# Patient Record
Sex: Female | Born: 1975 | Race: Black or African American | Hispanic: No | Marital: Single | Smoking: Current some day smoker
Health system: Southern US, Community
[De-identification: ages and names within clinical notes are randomized; demographics above are authoritative.]

## PROBLEM LIST (undated history)

## (undated) DIAGNOSIS — I10 Essential (primary) hypertension: Secondary | ICD-10-CM

---

## 2003-12-20 ENCOUNTER — Emergency Department: Payer: Self-pay | Admitting: Emergency Medicine

## 2004-02-02 ENCOUNTER — Emergency Department: Payer: Self-pay | Admitting: Emergency Medicine

## 2004-05-23 ENCOUNTER — Emergency Department: Payer: Self-pay | Admitting: Emergency Medicine

## 2004-06-22 ENCOUNTER — Emergency Department: Payer: Self-pay | Admitting: Unknown Physician Specialty

## 2004-09-06 ENCOUNTER — Other Ambulatory Visit: Payer: Self-pay

## 2004-09-06 ENCOUNTER — Emergency Department: Payer: Self-pay | Admitting: Unknown Physician Specialty

## 2005-04-12 ENCOUNTER — Ambulatory Visit: Payer: Self-pay | Admitting: Internal Medicine

## 2005-05-04 ENCOUNTER — Emergency Department: Payer: Self-pay | Admitting: Unknown Physician Specialty

## 2005-05-25 ENCOUNTER — Emergency Department: Payer: Self-pay | Admitting: Emergency Medicine

## 2005-06-22 ENCOUNTER — Emergency Department: Payer: Self-pay | Admitting: Emergency Medicine

## 2005-06-29 ENCOUNTER — Emergency Department: Payer: Self-pay | Admitting: Emergency Medicine

## 2005-08-05 ENCOUNTER — Emergency Department: Payer: Self-pay | Admitting: Emergency Medicine

## 2005-10-05 ENCOUNTER — Emergency Department: Payer: Self-pay | Admitting: Unknown Physician Specialty

## 2006-01-11 ENCOUNTER — Other Ambulatory Visit: Payer: Self-pay

## 2006-01-11 ENCOUNTER — Emergency Department: Payer: Self-pay

## 2006-05-16 ENCOUNTER — Other Ambulatory Visit: Payer: Self-pay

## 2006-05-16 ENCOUNTER — Emergency Department: Payer: Self-pay | Admitting: Emergency Medicine

## 2006-06-28 ENCOUNTER — Emergency Department: Payer: Self-pay | Admitting: Emergency Medicine

## 2006-09-15 ENCOUNTER — Emergency Department: Payer: Self-pay | Admitting: Emergency Medicine

## 2006-11-07 ENCOUNTER — Emergency Department: Payer: Self-pay

## 2006-11-07 ENCOUNTER — Other Ambulatory Visit: Payer: Self-pay

## 2006-12-01 ENCOUNTER — Emergency Department: Payer: Self-pay | Admitting: Internal Medicine

## 2007-08-17 ENCOUNTER — Emergency Department: Payer: Self-pay | Admitting: Internal Medicine

## 2007-12-03 IMAGING — CR DG LUMBAR SPINE AP/LAT/OBLIQUES W/ FLEX AND EXT
1 series · 5 of 5 positions shown · non-contrast
Comparison: none

REASON FOR EXAM: Back pain
COMMENTS:

[Series 1: view not recorded · 0.17mm/px · 5 of 5 slices shown]
[im 1/5]
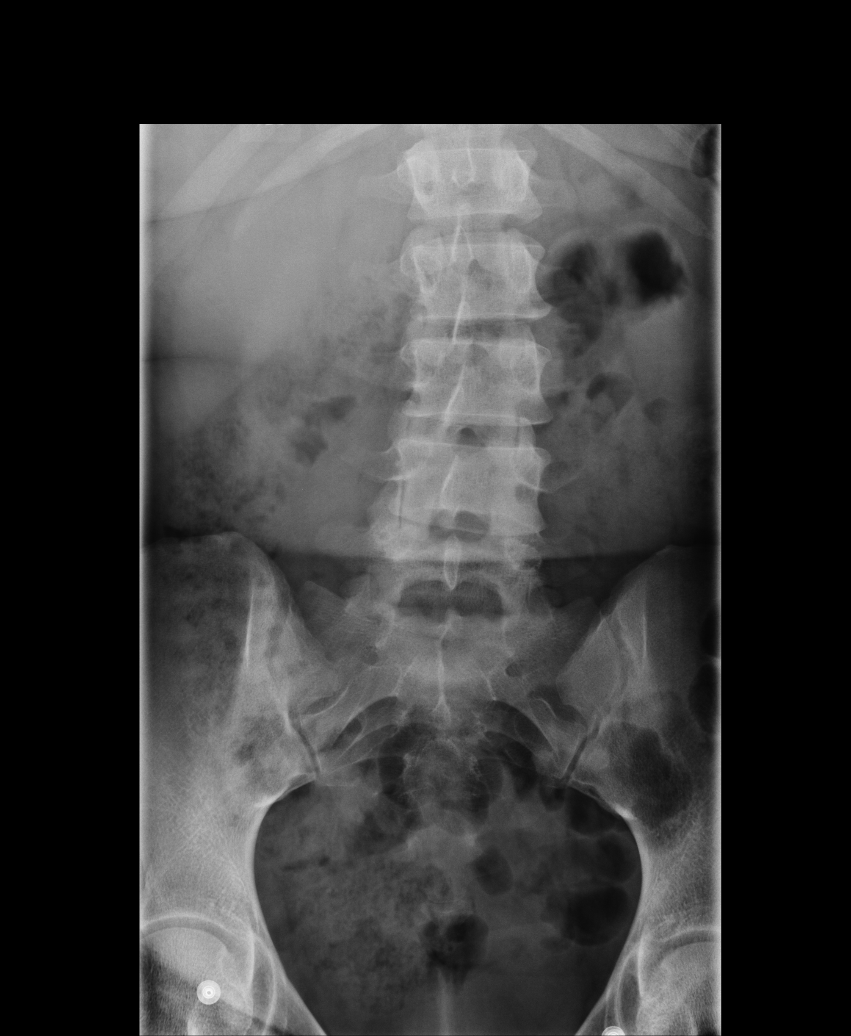
[im 2/5]
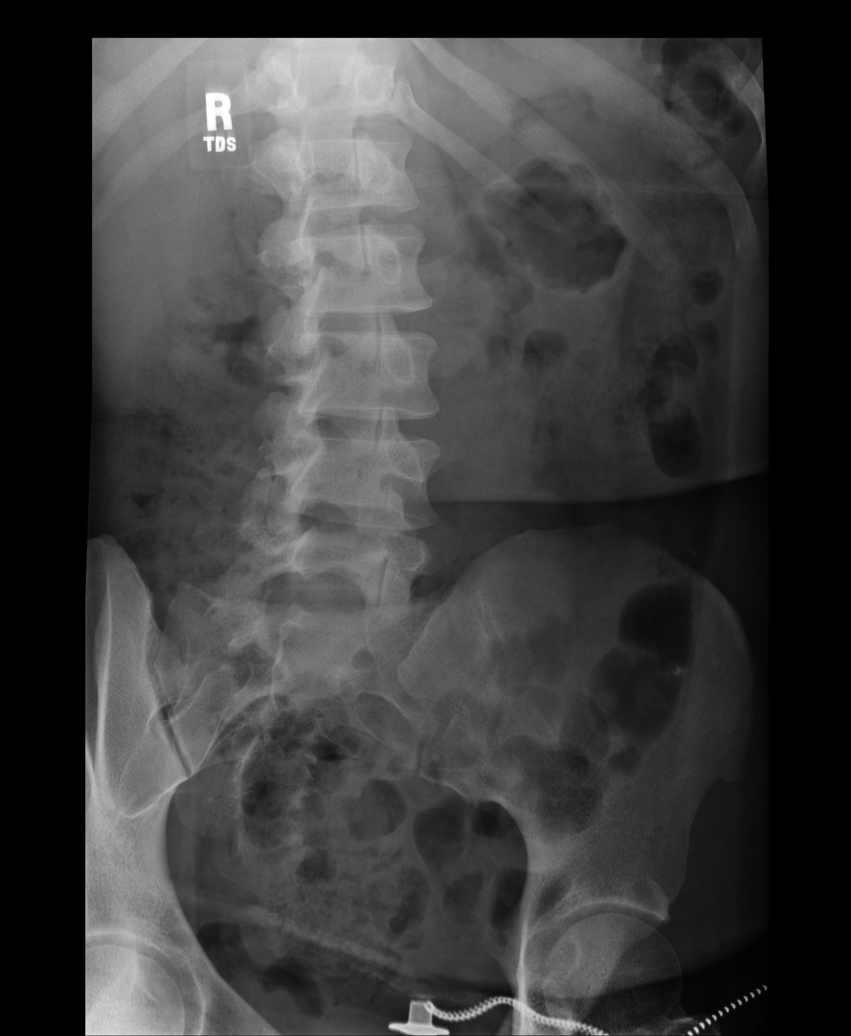
[im 3/5]
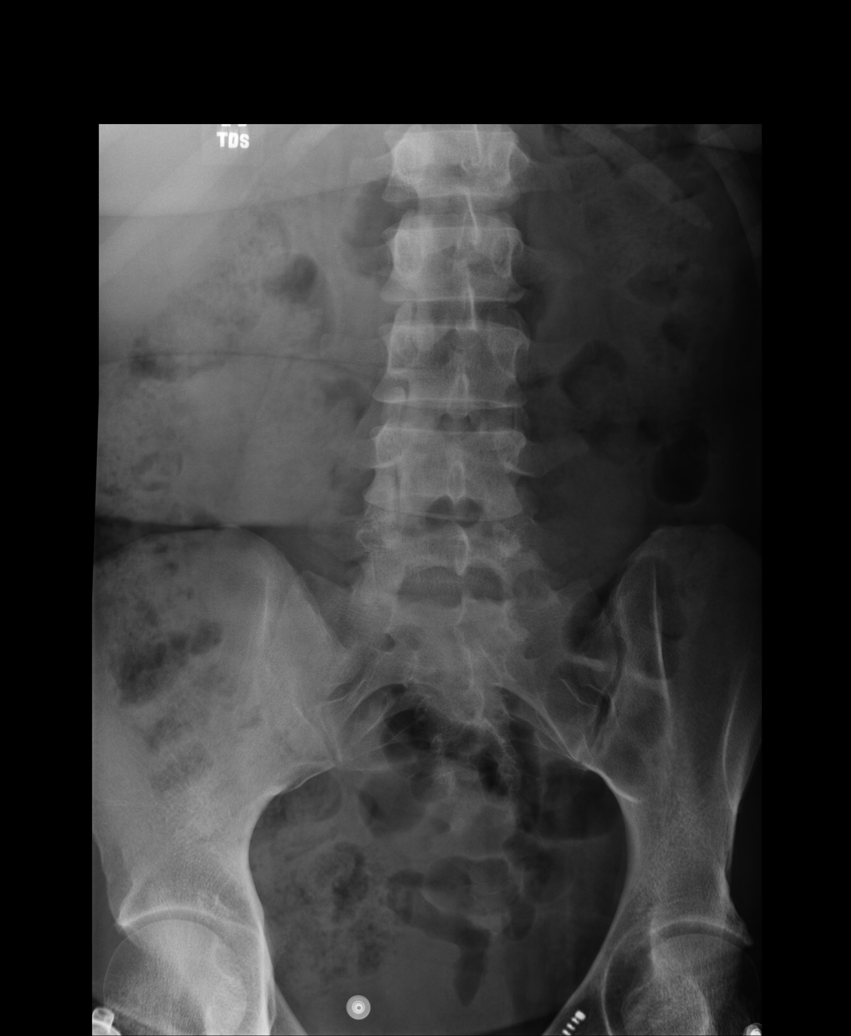
[im 4/5]
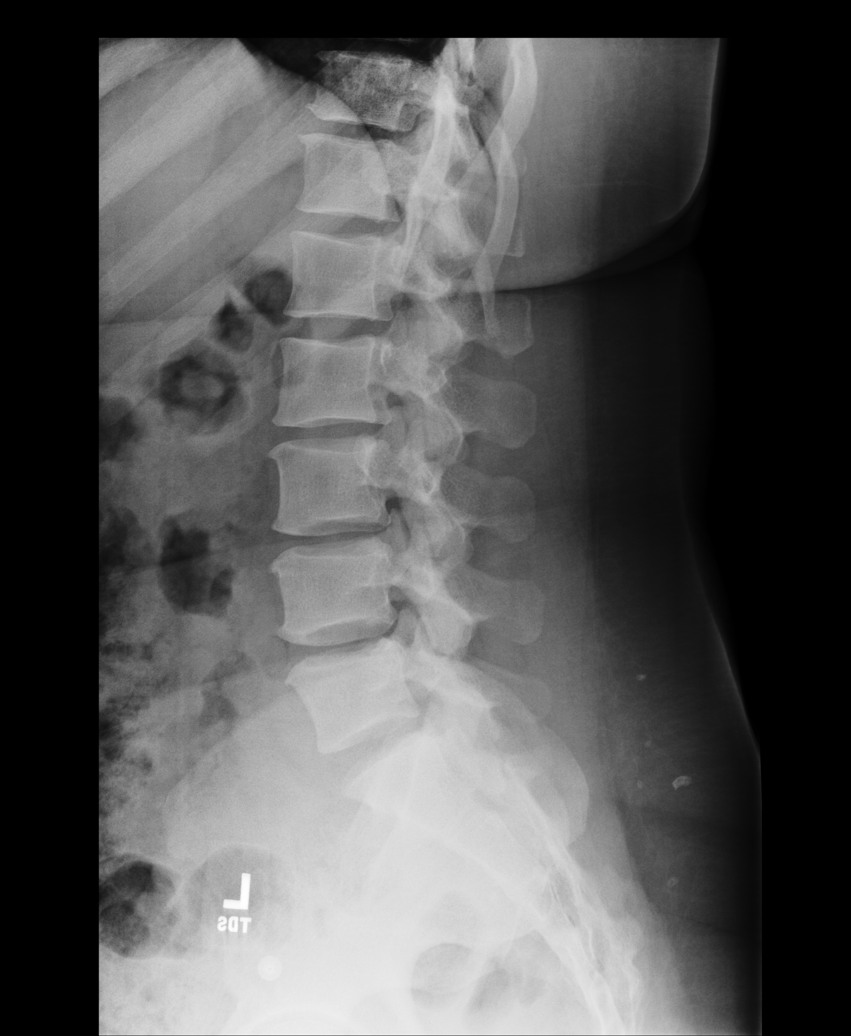
[im 5/5]
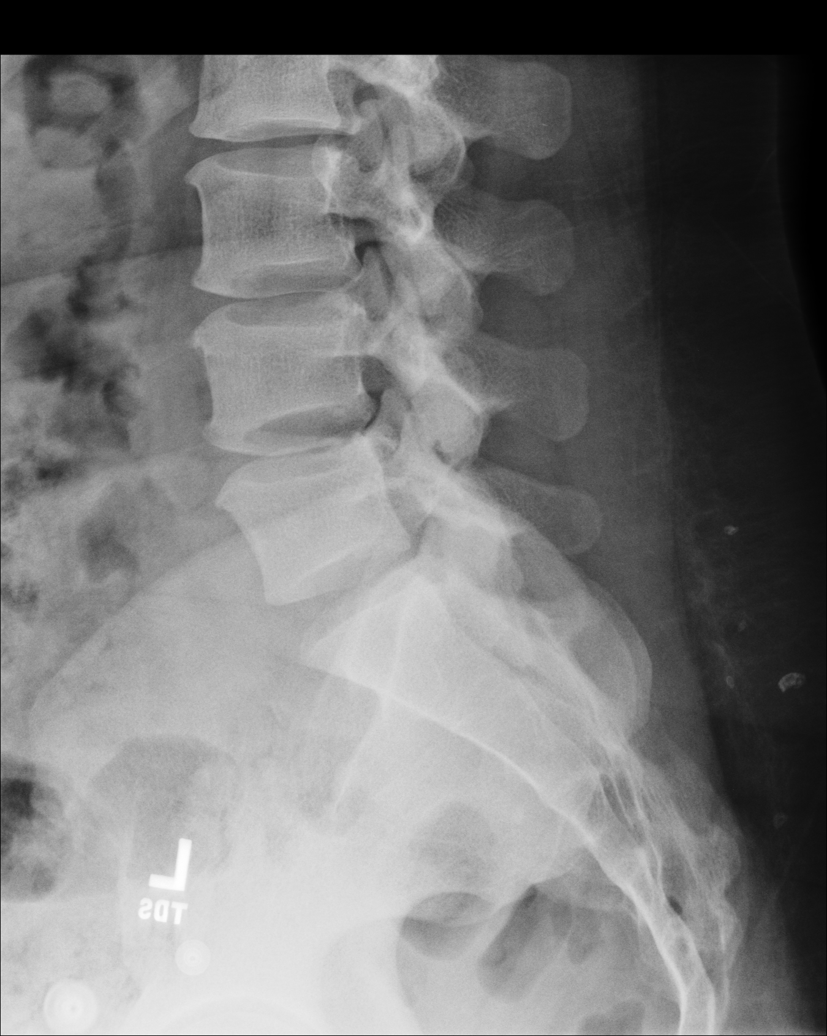

[5 of 5 positions shown; findings below may reference images not displayed]

PROCEDURE:     DXR - DXR LUMBAR SPINE WITH OBLIQUES  - May 25, 2005 [DATE]

RESULT:          Findings consistent with early degenerative disc disease
are appreciated at the T11-12 level, the L2-3 level, and 3, 4 and 5 levels.
This is evident by mild anterior hypertrophic spurring.  No evidence of
acute fracture or dislocation is appreciated.  Five non-rib-bearing lumbar
vertebral bodies are appreciated.
IMPRESSION: Findings likely representing sequela of early or mild
degenerative disc disease as described above without evidence of acute
fracture.  If there are persistent complaints of pain or persistent clinical
concern, further evaluation with lumbar MRI is recommended if clinically
warranted.

## 2008-12-26 ENCOUNTER — Emergency Department: Payer: Self-pay | Admitting: Emergency Medicine

## 2009-11-07 ENCOUNTER — Emergency Department: Payer: Self-pay | Admitting: Emergency Medicine

## 2012-05-18 IMAGING — CR DG CHEST 2V
1 series · 2 of 2 positions shown · non-contrast
Comparison: none

REASON FOR EXAM: COUGH, CP
COMMENTS:

PROCEDURE:     DXR - DXR CHEST PA (OR AP) AND LATERAL  - November 08, 2009  [DATE]
RESULT:     Comparison: 01/11/2006

[Series 1: view not recorded · 0.17mm/px · 2 of 2 slices shown]
[im 1/2]
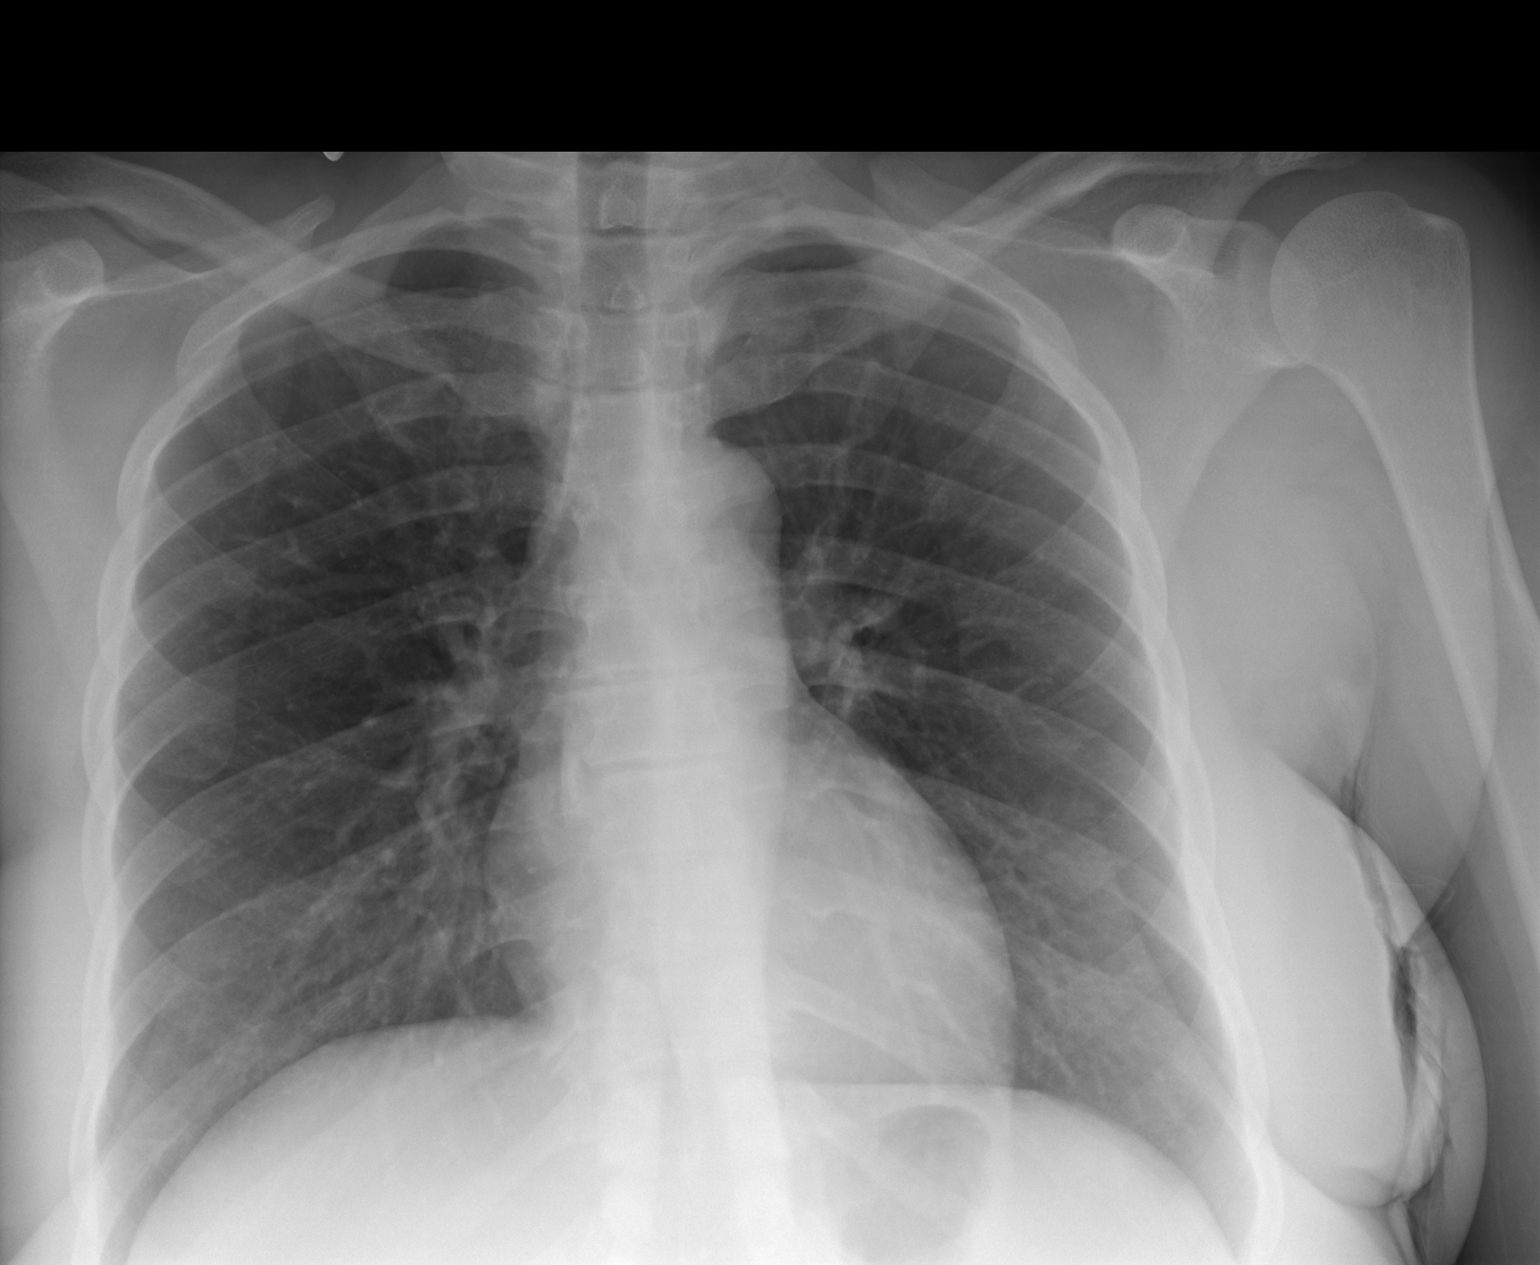
[im 2/2]
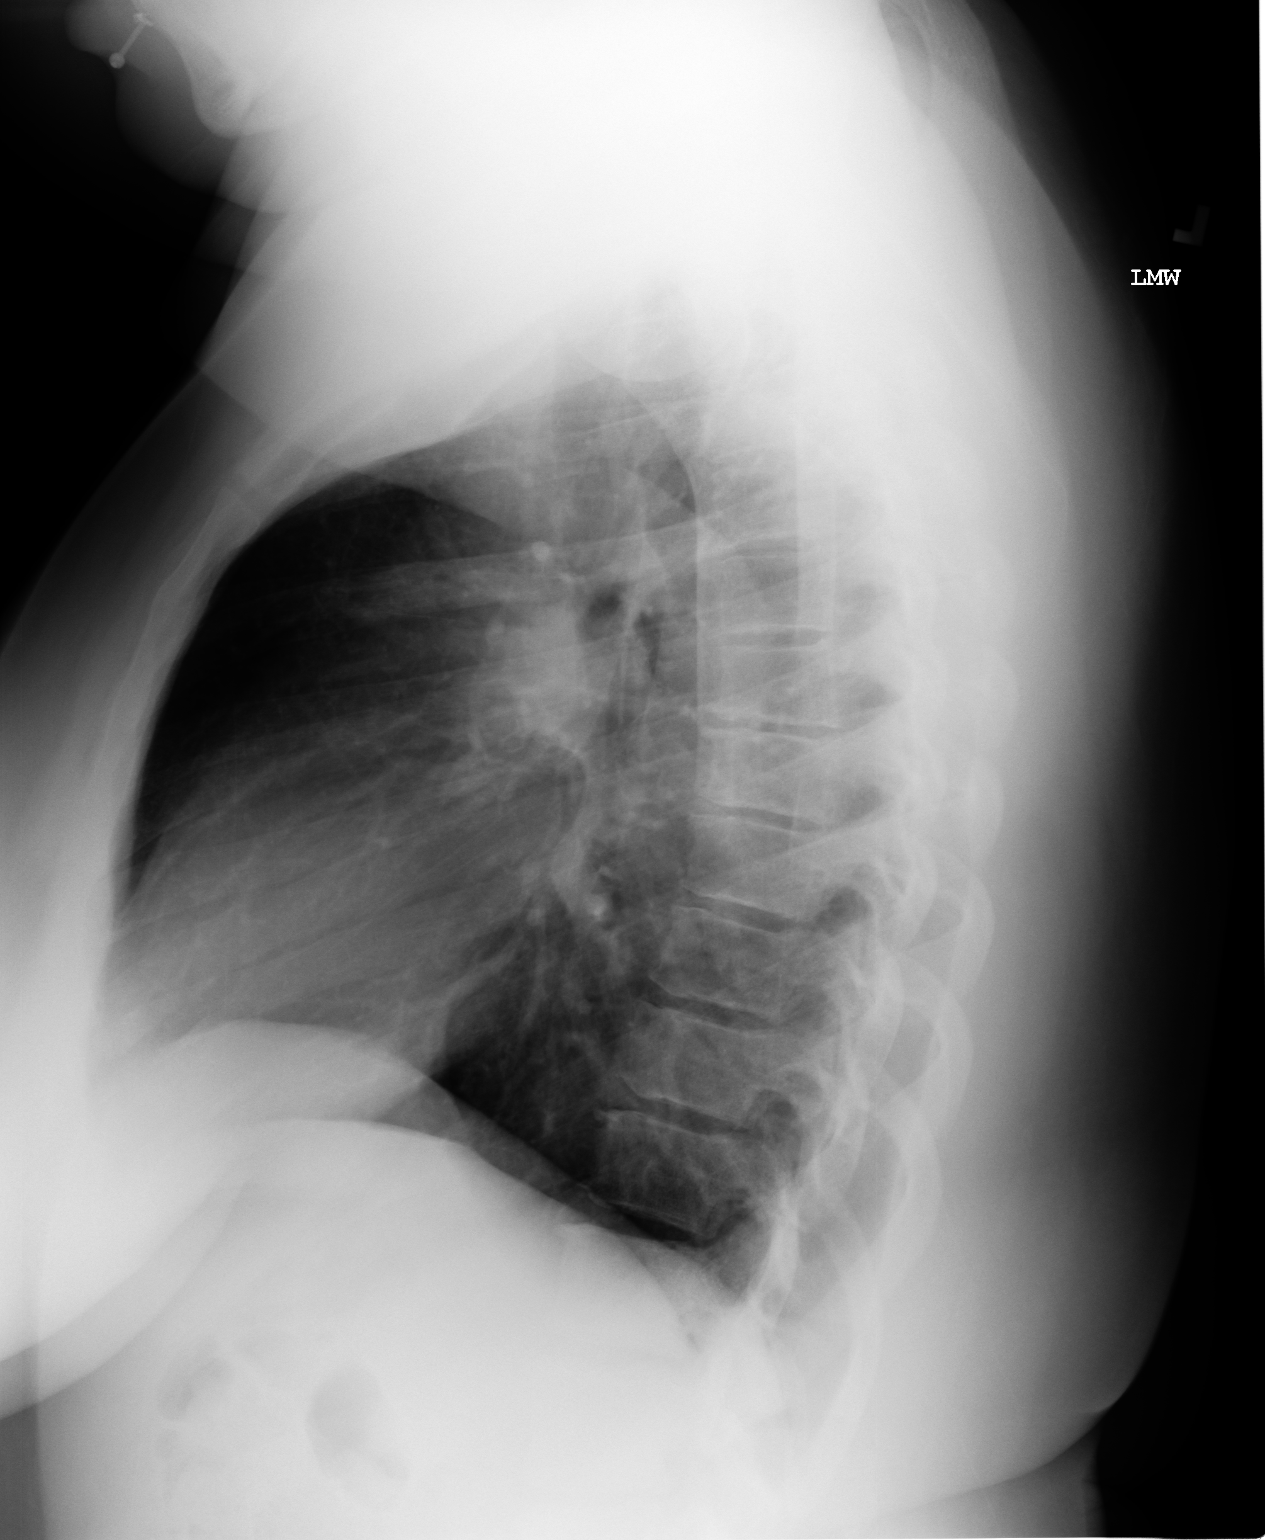

[2 of 2 positions shown; findings below may reference images not displayed]

FINDINGS: Heart and mediastinum are within normal limits. The lungs are clear. The
very tip of the right costophrenic angle is excluded the field-of-view.
IMPRESSION: No acute cardiopulmonary disease.

## 2012-08-11 ENCOUNTER — Emergency Department: Payer: Self-pay | Admitting: Emergency Medicine

## 2012-08-11 LAB — URINALYSIS, COMPLETE
Bacteria: NONE SEEN
Bilirubin,UR: NEGATIVE
Blood: NEGATIVE
Glucose,UR: NEGATIVE mg/dL
Hyaline Cast: 3
Ketone: NEGATIVE
Nitrite: NEGATIVE
Ph: 6
Protein: NEGATIVE
RBC,UR: 4 /HPF
Specific Gravity: 1.024
Squamous Epithelial: 6
WBC UR: 9 /HPF

## 2019-01-19 ENCOUNTER — Other Ambulatory Visit: Payer: Self-pay

## 2019-01-19 DIAGNOSIS — Z20822 Contact with and (suspected) exposure to covid-19: Secondary | ICD-10-CM

## 2019-01-20 LAB — NOVEL CORONAVIRUS, NAA: SARS-CoV-2, NAA: NOT DETECTED

## 2019-01-22 ENCOUNTER — Telehealth: Payer: Self-pay | Admitting: Hematology

## 2019-01-22 NOTE — Telephone Encounter (Signed)
Pt is aware covid 19 results is neg °

## 2019-04-12 ENCOUNTER — Ambulatory Visit: Payer: Medicaid Other | Attending: Internal Medicine

## 2019-04-12 DIAGNOSIS — U071 COVID-19: Secondary | ICD-10-CM | POA: Insufficient documentation

## 2019-04-12 DIAGNOSIS — Z20822 Contact with and (suspected) exposure to covid-19: Secondary | ICD-10-CM

## 2019-04-13 LAB — NOVEL CORONAVIRUS, NAA: SARS-CoV-2, NAA: DETECTED — AB

## 2019-04-25 ENCOUNTER — Ambulatory Visit: Payer: Medicaid Other | Attending: Internal Medicine

## 2019-04-25 ENCOUNTER — Ambulatory Visit: Payer: Medicaid Other

## 2019-04-25 DIAGNOSIS — Z20822 Contact with and (suspected) exposure to covid-19: Secondary | ICD-10-CM

## 2019-04-26 LAB — NOVEL CORONAVIRUS, NAA: SARS-CoV-2, NAA: NOT DETECTED

## 2019-05-01 ENCOUNTER — Ambulatory Visit: Payer: Medicaid Other | Attending: Internal Medicine

## 2019-05-01 DIAGNOSIS — Z20822 Contact with and (suspected) exposure to covid-19: Secondary | ICD-10-CM

## 2019-05-02 LAB — NOVEL CORONAVIRUS, NAA: SARS-CoV-2, NAA: NOT DETECTED

## 2019-05-03 ENCOUNTER — Telehealth: Payer: Self-pay

## 2019-05-03 NOTE — Telephone Encounter (Signed)
Patient called in and received her negative covid test result  

## 2021-10-19 ENCOUNTER — Ambulatory Visit
Admission: EM | Admit: 2021-10-19 | Discharge: 2021-10-19 | Disposition: A | Discharge status: Home / Self Care | Payer: Medicaid Other

## 2021-10-19 ENCOUNTER — Other Ambulatory Visit: Payer: Self-pay

## 2021-10-19 ENCOUNTER — Encounter: Payer: Self-pay | Admitting: Emergency Medicine

## 2021-10-19 DIAGNOSIS — I10 Essential (primary) hypertension: Secondary | ICD-10-CM

## 2021-10-19 HISTORY — DX: Essential (primary) hypertension: I10

## 2021-10-19 MED ORDER — LOSARTAN POTASSIUM 50 MG PO TABS: 50.0000 mg | ORAL_TABLET | Freq: Every day | ORAL | 0 refills | Status: DC

## 2021-10-19 MED ORDER — AMLODIPINE BESYLATE 10 MG PO TABS: 10.0000 mg | ORAL_TABLET | Freq: Every day | ORAL | 0 refills | Status: DC

## 2021-10-19 MED ORDER — CHLORTHALIDONE 25 MG PO TABS: 25.0000 mg | ORAL_TABLET | Freq: Every day | ORAL | 0 refills | Status: DC

## 2021-10-19 MED ORDER — HYDROXYZINE HCL 10 MG PO TABS: 10.0000 mg | ORAL_TABLET | Freq: Three times a day (TID) | ORAL | 0 refills | Status: DC | PRN

## 2021-10-19 NOTE — Discharge Instructions (Addendum)
Hypertension refilled her medication  Anxiety; Hydroxyzine 10 mg 1 three times a day Follow up with PCP for further refills.

## 2021-10-19 NOTE — ED Triage Notes (Signed)
Vomiting and dizziness last week.  Patient went to [facility].   Blood pressure was "155/135 " per patient.  Was not prescribed any medicine.    Patient does not have a pcp currently, but has a first appt with [facility] in September.  Denies any edema, sob or chest pain

## 2021-10-19 NOTE — ED Provider Notes (Addendum)
[facility]    CSN: [id number] Arrival date & time: 10/19/21  1607      History   Chief Complaint Chief Complaint  Patient presents with   Hypertension    HPI Desiree Beasley is a 46 y.o. female.   HPI She is in today for high blood pressure. Denies fever, chills, headache, dizziness, visual changes,shortness of breath, dyspnea on exertion, chest pain, nausea, constipation, diarrhea, or any edema. She reports that she is anxious since the death of her mother. She reports that she was on previous treatment that was over powering so this was discontinued.  Past Medical History:  Diagnosis Date   Hypertension     There are no problems to display for this patient.   History reviewed. No pertinent surgical history.  OB History   No obstetric history on file.      Home Medications    Prior to Admission medications   Medication Sig Start Date End Date Taking? Authorizing Provider  amLODipine (NORVASC) 10 MG tablet Take 1 tablet (10 mg total) by mouth daily. 10/19/21 11/18/21 Yes Barbette Merino, NP  chlorthalidone (HYGROTON) 25 MG tablet Take 1 tablet (25 mg total) by mouth daily. 10/19/21 11/18/21 Yes Barbette Merino, NP  hydrOXYzine (ATARAX) 10 MG tablet Take 1 tablet (10 mg total) by mouth every 8 (eight) hours as needed for itching. 10/19/21 11/18/21 Yes Danilyn Cocke, Shana Chute, NP  losartan (COZAAR) 50 MG tablet Take 1 tablet (50 mg total) by mouth daily. 10/19/21 11/18/21 Yes Barbette Merino, NP  NON FORMULARY    Yes [provider]  meclizine (ANTIVERT) 25 MG tablet Take 25 mg by mouth 3 (three) times daily as needed. 10/14/21   [provider]    Family History History reviewed. No pertinent family history.  Social History Social History   Tobacco Use   Smoking status: Some Days    Types: Cigarettes   Smokeless tobacco: Never  Vaping Use   Vaping Use: Never used  Substance Use Topics   Alcohol use: Yes   Drug use: Never     Allergies    Lisinopril and Sulfa antibiotics   Review of Systems Review of Systems   Physical Exam Triage Vital Signs ED Triage Vitals  Enc Vitals Group     BP 10/19/21 1650 (!) 154/107     Pulse Rate 10/19/21 1650 100     Resp 10/19/21 1650 20     Temp 10/19/21 1650 98.6 F (37 C)     Temp Source 10/19/21 1650 Oral     SpO2 10/19/21 1650 98 %     Weight --      Height --      Head Circumference --      Peak Flow --      Pain Score 10/19/21 1644 0     Pain Loc --      Pain Edu? --      Excl. in [place]? --    No data found.  Updated Vital Signs BP (!) 149/103   Pulse 100   Temp 98.6 F (37 C) (Oral)   Resp 20   LMP 09/14/2021   SpO2 98%   Visual Acuity Right Eye Distance:   Left Eye Distance:   Bilateral Distance:    Right Eye Near:   Left Eye Near:    Bilateral Near:     Physical Exam Constitutional:      General: She is not in acute distress.  Appearance: She is obese. She is not ill-appearing, toxic-appearing or diaphoretic.  HENT:     Head: Normocephalic and atraumatic.  Cardiovascular:     Rate and Rhythm: Normal rate.  Pulmonary:     Effort: Pulmonary effort is normal.  Musculoskeletal:        General: Normal range of motion.  Skin:    General: Skin is warm and dry.     Capillary Refill: Capillary refill takes less than 2 seconds.  Neurological:     General: No focal deficit present.     Mental Status: She is alert and oriented to person, place, and time.  Psychiatric:        Mood and Affect: Mood normal.     Comments: Tearful      UC Treatments / Results  Labs (all labs ordered are listed, but only abnormal results are displayed) Labs Reviewed - No data to display  EKG   Radiology No results found.  Procedures Procedures (including critical care time)  Medications Ordered in UC Medications - No data to display  Initial Impression / Assessment and Plan / UC Course  I have reviewed the triage vital signs and the nursing  notes.  Pertinent labs & imaging results that were available during my care of the patient were reviewed by me and considered in my medical decision making (see chart for details).     Hypertension Final Clinical Impressions(s) / UC Diagnoses   Final diagnoses:  Essential hypertension     Discharge Instructions      Hypertension refilled her medication  Anxiety; Hydroxyzine 10 mg 1 three times a day Follow up with PCP for further refills.        ED Prescriptions     Medication Sig Dispense Auth. Provider   amLODipine (NORVASC) 10 MG tablet Take 1 tablet (10 mg total) by mouth daily. 30 tablet Thad Ranger M, NP   chlorthalidone (HYGROTON) 25 MG tablet Take 1 tablet (25 mg total) by mouth daily. 30 tablet Thad Ranger M, NP   losartan (COZAAR) 50 MG tablet Take 1 tablet (50 mg total) by mouth daily. 30 tablet Barbette Merino, NP   hydrOXYzine (ATARAX) 10 MG tablet Take 1 tablet (10 mg total) by mouth every 8 (eight) hours as needed for itching. 60 tablet Barbette Merino, NP      PDMP not reviewed this encounter.   Barbette Merino, NP 10/19/21 1914    Barbette Merino, NP 10/19/21 1914

## 2021-11-15 ENCOUNTER — Other Ambulatory Visit: Payer: Self-pay | Admitting: Nurse Practitioner

## 2021-12-08 ENCOUNTER — Encounter: Payer: Self-pay | Admitting: Emergency Medicine

## 2021-12-08 ENCOUNTER — Ambulatory Visit
Admission: EM | Admit: 2021-12-08 | Discharge: 2021-12-08 | Disposition: A | Discharge status: Home / Self Care | Payer: Medicaid Other | Attending: Physician Assistant | Admitting: Physician Assistant

## 2021-12-08 DIAGNOSIS — I10 Essential (primary) hypertension: Secondary | ICD-10-CM

## 2021-12-08 DIAGNOSIS — Z760 Encounter for issue of repeat prescription: Secondary | ICD-10-CM

## 2021-12-08 DIAGNOSIS — R42 Dizziness and giddiness: Secondary | ICD-10-CM

## 2021-12-08 MED ORDER — MECLIZINE HCL 25 MG PO TABS: 25.0000 mg | ORAL_TABLET | Freq: Three times a day (TID) | ORAL | 0 refills | Status: DC | PRN

## 2021-12-08 MED ORDER — LOSARTAN POTASSIUM 50 MG PO TABS: 50.0000 mg | ORAL_TABLET | Freq: Every day | ORAL | 0 refills | Status: DC

## 2021-12-08 MED ORDER — CLONIDINE HCL 0.1 MG PO TABS: 0.1000 mg | ORAL_TABLET | Freq: Every day | ORAL | Status: DC

## 2021-12-08 MED ORDER — CHLORTHALIDONE 25 MG PO TABS: 25.0000 mg | ORAL_TABLET | Freq: Every day | ORAL | 0 refills | Status: DC

## 2021-12-08 MED ORDER — CLONIDINE HCL 0.1 MG PO TABS
0.1000 mg | ORAL_TABLET | Freq: Once | ORAL | Status: AC
  Administered 2021-12-08: 0.1 mg via ORAL

## 2021-12-08 MED ORDER — AMLODIPINE BESYLATE 10 MG PO TABS: 10.0000 mg | ORAL_TABLET | Freq: Every day | ORAL | 0 refills | Status: DC

## 2021-12-08 NOTE — ED Triage Notes (Signed)
Pt c/o vertigo and hypertension. She has been out of her vertigo and BP medication for 4 days. She is scheduled to see a new PCP at the beginning of October. Denies sob, chest pain or swelling.

## 2021-12-08 NOTE — ED Provider Notes (Signed)
[facility]    CSN: [id number] Arrival date & time: 12/08/21  0815      History   Chief Complaint Chief Complaint  Patient presents with   Hypertension   Dizziness    HPI Desiree Beasley is a 46 y.o. female presenting for refill of blood pressure medications.  Patient takes amlodipine, chlorthalidone, and losartan.  She says she has been out of the medications for about 4 days and she started to feel dizzy and have vertigo today.  She thinks the dizziness may be related to her blood pressure being high.  She says when she was taking the medication her blood pressure was steady at 120/90 when she would check it.  She says she also did not have any dizziness at that time.  Occasionally takes meclizine for the dizziness and would like a refill of that as well.  Patient does have an appointment coming up in the next 3 weeks to see a new PCP at [facility].  She says she just needs a refill of the medication to get to that appointment.  She is not having any chest pain, palpitations, shortness of breath, severe headaches, vision changes, numbness/tingling or weakness.  Of note, patient went to [facility] emergency department on 10/13/2021 for the same symptoms and had an MRI of her brain which was normal.  She was referred to vestibular rehab.  HPI  Past Medical History:  Diagnosis Date   Hypertension     There are no problems to display for this patient.   History reviewed. No pertinent surgical history.  OB History   No obstetric history on file.      Home Medications    Prior to Admission medications   Medication Sig Start Date End Date Taking? Authorizing Provider  amLODipine (NORVASC) 10 MG tablet Take 1 tablet (10 mg total) by mouth daily. 12/08/21 01/07/22  Danton Clap, PA-C  chlorthalidone (HYGROTON) 25 MG tablet Take 1 tablet (25 mg total) by mouth daily. 12/08/21 01/07/22  Laurene Footman B, PA-C  losartan (COZAAR) 50 MG tablet Take 1 tablet (50 mg  total) by mouth daily. 12/08/21 01/07/22  Danton Clap, PA-C  meclizine (ANTIVERT) 25 MG tablet Take 1 tablet (25 mg total) by mouth 3 (three) times daily as needed. 12/08/21   Danton Clap, PA-C  NON FORMULARY     [provider]    Family History No family history on file.  Social History Social History   Tobacco Use   Smoking status: Some Days    Types: Cigarettes   Smokeless tobacco: Never  Vaping Use   Vaping Use: Never used  Substance Use Topics   Alcohol use: Yes   Drug use: Never     Allergies   Lisinopril and Sulfa antibiotics   Review of Systems Review of Systems  Constitutional:  Negative for fatigue.  Eyes:  Negative for visual disturbance.  Respiratory:  Negative for shortness of breath.   Cardiovascular:  Negative for chest pain, palpitations and leg swelling.  Gastrointestinal:  Negative for nausea and vomiting.  Neurological:  Positive for dizziness. Negative for facial asymmetry, weakness, light-headedness, numbness and headaches.     Physical Exam Triage Vital Signs ED Triage Vitals  Enc Vitals Group     BP      Pulse      Resp      Temp      Temp src      SpO2  Weight      Height      Head Circumference      Peak Flow      Pain Score      Pain Loc      Pain Edu?      Excl. in [place]?    No data found.  Updated Vital Signs BP (!) 189/145 (BP Location: Right Arm)   Pulse 95   Temp 98.8 F (37.1 C) (Oral)   Resp 16   Ht 6' 3.5" (1.918 m)   Wt 240 lb (108.9 kg)   LMP 10/27/2021 (Approximate)   SpO2 97%   BMI 29.60 kg/m     Physical Exam Vitals and nursing note reviewed.  Constitutional:      General: She is not in acute distress.    Appearance: Normal appearance. She is not ill-appearing or toxic-appearing.  HENT:     Head: Normocephalic and atraumatic.     Nose: Nose normal.     Mouth/Throat:     Mouth: Mucous membranes are moist.     Pharynx: Oropharynx is clear.  Eyes:     General: No scleral icterus.        Right eye: No discharge.        Left eye: No discharge.     Extraocular Movements: Extraocular movements intact.     Conjunctiva/sclera: Conjunctivae normal.     Pupils: Pupils are equal, round, and reactive to light.  Cardiovascular:     Rate and Rhythm: Normal rate and regular rhythm.     Heart sounds: Normal heart sounds.  Pulmonary:     Effort: Pulmonary effort is normal. No respiratory distress.     Breath sounds: Normal breath sounds.  Musculoskeletal:     Cervical back: Neck supple.  Skin:    General: Skin is dry.  Neurological:     General: No focal deficit present.     Mental Status: She is alert and oriented to person, place, and time. Mental status is at baseline.     Cranial Nerves: No cranial nerve deficit.     Motor: No weakness.     Coordination: Coordination normal.     Gait: Gait normal.  Psychiatric:        Mood and Affect: Mood normal.        Behavior: Behavior normal.        Thought Content: Thought content normal.      UC Treatments / Results  Labs (all labs ordered are listed, but only abnormal results are displayed) Labs Reviewed - No data to display  EKG   Radiology No results found.  Procedures Procedures (including critical care time)  Medications Ordered in UC Medications  cloNIDine (CATAPRES) tablet 0.1 mg (0.1 mg Oral Given 12/08/21 0900)    Initial Impression / Assessment and Plan / UC Course  I have reviewed the triage vital signs and the nursing notes.  Pertinent labs & imaging results that were available during my care of the patient were reviewed by me and considered in my medical decision making (see chart for details).   46 year old female presents for elevated blood pressure and refill of medications as well as dizziness.  Patient reports that she takes amlodipine, chlorthalidone and losartan and blood pressure stays in the normal range.  She has been out of all medications and stopped them cold [place] 4 days ago.  Now  she has had some dizziness today and had to leave work.  Requested refills of medications.  Has  an appointment with new PCP in the next 3 weeks.  BP is 189/145.  Other vitals normal and stable.  She is overall well-appearing.  Exam is benign today.  Chest clear auscultation heart regular rate and rhythm.  Normal cranial nerve exam.  Patient asked for something to help lower her blood pressure at this time so patient given 0.1 mg clonidine.  We will monitor and recheck blood pressure in about 20 minutes.  Refilled medications for patient and advised her to seek further refills through her new primary care provider.  Encouraged her to check her blood pressure regularly and keep a log to take to her new PCP appointment.  Advised to exercise, lose weight, reduce dietary sodium and stay hydrated.  Reviewed going to emergency department if she has any severe headaches, vision changes, chest pain, breathing difficulty, palpitations or worsening dizziness.  Work note given.  BP recheck is 169/109.  Final Clinical Impressions(s) / UC Diagnoses   Final diagnoses:  Medication refill  Essential hypertension  Dizziness     Discharge Instructions      Refilled medications for you. Please seek further refills through your new primary care provider. Check blood pressure regularly and keep a log to take to new PCP appointment.  Advised to exercise, lose weight, reduce dietary sodium and stay hydrated.  Reviewed going to emergency department if you have any severe headaches, vision changes, chest pain, breathing difficulty, palpitations or worsening dizziness.      ED Prescriptions     Medication Sig Dispense Auth. Provider   amLODipine (NORVASC) 10 MG tablet Take 1 tablet (10 mg total) by mouth daily. 30 tablet Laurene Footman B, PA-C   chlorthalidone (HYGROTON) 25 MG tablet Take 1 tablet (25 mg total) by mouth daily. 30 tablet Laurene Footman B, PA-C   losartan (COZAAR) 50 MG tablet Take 1 tablet (50 mg  total) by mouth daily. 30 tablet Danton Clap, PA-C   meclizine (ANTIVERT) 25 MG tablet Take 1 tablet (25 mg total) by mouth 3 (three) times daily as needed. 30 tablet Gretta Cool      PDMP not reviewed this encounter.   Danton Clap, PA-C 12/08/21 [phone]

## 2021-12-08 NOTE — Discharge Instructions (Addendum)
Refilled medications for you. Please seek further refills through your new primary care provider. Check blood pressure regularly and keep a log to take to new PCP appointment.  Advised to exercise, lose weight, reduce dietary sodium and stay hydrated.  Reviewed going to emergency department if you have any severe headaches, vision changes, chest pain, breathing difficulty, palpitations or worsening dizziness.

## 2022-01-03 ENCOUNTER — Other Ambulatory Visit: Payer: Self-pay | Admitting: Nurse Practitioner

## 2022-01-17 ENCOUNTER — Telehealth: Payer: Self-pay | Admitting: Family Medicine

## 2022-01-17 ENCOUNTER — Telehealth: Payer: Self-pay | Admitting: Family

## 2022-01-17 DIAGNOSIS — F419 Anxiety disorder, unspecified: Secondary | ICD-10-CM

## 2022-01-17 DIAGNOSIS — I152 Hypertension secondary to endocrine disorders: Secondary | ICD-10-CM

## 2022-01-17 DIAGNOSIS — I10 Essential (primary) hypertension: Secondary | ICD-10-CM

## 2022-01-17 DIAGNOSIS — E1159 Type 2 diabetes mellitus with other circulatory complications: Secondary | ICD-10-CM

## 2022-01-17 DIAGNOSIS — R42 Dizziness and giddiness: Secondary | ICD-10-CM

## 2022-01-17 MED ORDER — CHLORTHALIDONE 25 MG PO TABS
25.0000 mg | ORAL_TABLET | Freq: Every day | ORAL | 0 refills | Status: AC
Start: 2022-01-17 — End: 2022-02-16

## 2022-01-17 MED ORDER — CHLORTHALIDONE 25 MG PO TABS
25.0000 mg | ORAL_TABLET | Freq: Every day | ORAL | 0 refills | Status: DC
Start: 2022-01-17 — End: 2022-01-17

## 2022-01-17 MED ORDER — AMLODIPINE BESYLATE 10 MG PO TABS: 10.0000 mg | ORAL_TABLET | Freq: Every day | ORAL | 0 refills | Status: AC

## 2022-01-17 MED ORDER — LOSARTAN POTASSIUM 50 MG PO TABS
50.0000 mg | ORAL_TABLET | Freq: Every day | ORAL | 0 refills | Status: DC
Start: 2022-01-17 — End: 2022-01-17

## 2022-01-17 MED ORDER — HYDROXYZINE HCL 10 MG PO TABS: 10.0000 mg | ORAL_TABLET | Freq: Three times a day (TID) | ORAL | 0 refills | Status: AC | PRN

## 2022-01-17 MED ORDER — MECLIZINE HCL 25 MG PO TABS
25.0000 mg | ORAL_TABLET | Freq: Three times a day (TID) | ORAL | 0 refills | Status: AC | PRN
Start: 2022-01-17 — End: ?

## 2022-01-17 MED ORDER — LOSARTAN POTASSIUM 50 MG PO TABS: 50.0000 mg | ORAL_TABLET | Freq: Every day | ORAL | 0 refills | Status: AC

## 2022-01-17 MED ORDER — AMLODIPINE BESYLATE 10 MG PO TABS: 10.0000 mg | ORAL_TABLET | Freq: Every day | ORAL | 0 refills | Status: DC

## 2022-01-17 NOTE — Patient Instructions (Signed)
Hypertension, Adult ?Hypertension is another name for high blood pressure. High blood pressure forces your heart to work harder to pump blood. This can cause problems over time. ?There are two numbers in a blood pressure reading. There is a top number (systolic) over a bottom number (diastolic). It is best to have a blood pressure that is below 120/80. ?What are the causes? ?The cause of this condition is not known. Some other conditions can lead to high blood pressure. ?What increases the risk? ?Some lifestyle factors can make you more likely to develop high blood pressure: ?Smoking. ?Not getting enough exercise or physical activity. ?Being overweight. ?Having too much fat, sugar, calories, or salt (sodium) in your diet. ?Drinking too much alcohol. ?Other risk factors include: ?Having any of these conditions: ?Heart disease. ?Diabetes. ?High cholesterol. ?Kidney disease. ?Obstructive sleep apnea. ?Having a family history of high blood pressure and high cholesterol. ?Age. The risk increases with age. ?Stress. ?What are the signs or symptoms? ?High blood pressure may not cause symptoms. Very high blood pressure (hypertensive crisis) may cause: ?Headache. ?Fast or uneven heartbeats (palpitations). ?Shortness of breath. ?Nosebleed. ?Vomiting or feeling like you may vomit (nauseous). ?Changes in how you see. ?Very bad chest pain. ?Feeling dizzy. ?Seizures. ?How is this treated? ?This condition is treated by making healthy lifestyle changes, such as: ?Eating healthy foods. ?Exercising more. ?Drinking less alcohol. ?Your doctor may prescribe medicine if lifestyle changes do not help enough and if: ?Your top number is above 130. ?Your bottom number is above 80. ?Your personal target blood pressure may vary. ?Follow these instructions at home: ?Eating and drinking ? ?If told, follow the DASH eating plan. To follow this plan: ?Fill one half of your plate at each meal with fruits and vegetables. ?Fill one fourth of your plate  at each meal with whole grains. Whole grains include whole-wheat pasta, brown rice, and whole-grain bread. ?Eat or drink low-fat dairy products, such as skim milk or low-fat yogurt. ?Fill one fourth of your plate at each meal with low-fat (lean) proteins. Low-fat proteins include fish, chicken without skin, eggs, beans, and tofu. ?Avoid fatty meat, cured and processed meat, or chicken with skin. ?Avoid pre-made or processed food. ?Limit the amount of salt in your diet to less than 1,500 mg each day. ?Do not drink alcohol if: ?Your doctor tells you not to drink. ?You are pregnant, may be pregnant, or are planning to become pregnant. ?If you drink alcohol: ?Limit how much you have to: ?0-1 drink a day for women. ?0-2 drinks a day for men. ?Know how much alcohol is in your drink. In the [place] one drink equals one 12 oz bottle of beer (355 mL), one 5 oz glass of wine (148 mL), or one 1? oz glass of hard liquor (44 mL). ?Lifestyle ? ?Work with your doctor to stay at a healthy weight or to lose weight. Ask your doctor what the best weight is for you. ?Get at least 30 minutes of exercise that causes your heart to beat faster (aerobic exercise) most days of the week. This may include walking, swimming, or biking. ?Get at least 30 minutes of exercise that strengthens your muscles (resistance exercise) at least 3 days a week. This may include lifting weights or doing Pilates. ?Do not smoke or use any products that contain nicotine or tobacco. If you need help quitting, ask your doctor. ?Check your blood pressure at home as told by your doctor. ?Keep all follow-up visits. ?Medicines ?Take over-the-counter and prescription medicines   only as told by your doctor. Follow directions carefully. ?Do not skip doses of blood pressure medicine. The medicine does not work as well if you skip doses. Skipping doses also puts you at risk for problems. ?Ask your doctor about side effects or reactions to medicines that you should watch  for. ?Contact a doctor if: ?You think you are having a reaction to the medicine you are taking. ?You have headaches that keep coming back. ?You feel dizzy. ?You have swelling in your ankles. ?You have trouble with your vision. ?Get help right away if: ?You get a very bad headache. ?You start to feel mixed up (confused). ?You feel weak or numb. ?You feel faint. ?You have very bad pain in your: ?Chest. ?Belly (abdomen). ?You vomit more than once. ?You have trouble breathing. ?These symptoms may be an emergency. Get help right away. Call 911. ?Do not wait to see if the symptoms will go away. ?Do not drive yourself to the hospital. ?Summary ?Hypertension is another name for high blood pressure. ?High blood pressure forces your heart to work harder to pump blood. ?For most people, a normal blood pressure is less than 120/80. ?Making healthy choices can help lower blood pressure. If your blood pressure does not get lower with healthy choices, you may need to take medicine. ?This information is not intended to replace advice given to you by your health care provider. Make sure you discuss any questions you have with your health care provider. ?Document Revised: 12/18/2020 Document Reviewed: 12/18/2020 ?Elsevier Patient Education ? 2023 [employer] ? ?

## 2022-01-17 NOTE — Progress Notes (Signed)
E-Visits are not used to request refills.  After reviewing your records, I can verify that you may be running out of a long term medication before your next scheduled appointment.  Based on this information, I can refill your on a one time basis.  Losartan, Norvasc, and chlorthalidone. You need to establish with a PCP.   Please contact your doctor as soon as possible to manage your prescription.  Approximately 5 minutes was spent documenting and reviewing patient's chart.

## 2022-01-17 NOTE — Progress Notes (Signed)
Virtual Visit Consent   Desiree Beasley, you are scheduled for a virtual visit with a [facility] provider today. Just as with appointments in the office, your consent must be obtained to participate. Your consent will be active for this visit and any virtual visit you may have with one of our providers in the next 365 days. If you have a MyChart account, a copy of this consent can be sent to you electronically.  As this is a virtual visit, video technology does not allow for your provider to perform a traditional examination. This may limit your provider's ability to fully assess your condition. If your provider identifies any concerns that need to be evaluated in person or the need to arrange testing (such as labs, EKG, etc.), we will make arrangements to do so. Although advances in technology are sophisticated, we cannot ensure that it will always work on either your end or our end. If the connection with a video visit is poor, the visit may have to be switched to a telephone visit. With either a video or telephone visit, we are not always able to ensure that we have a secure connection.  By engaging in this virtual visit, you consent to the provision of healthcare and authorize for your insurance to be billed (if applicable) for the services provided during this visit. Depending on your insurance coverage, you may receive a charge related to this service.  I need to obtain your verbal consent now. Are you willing to proceed with your visit today? Desiree Beasley has provided verbal consent on 48/07/2021 for a virtual visit (video or telephone). Dellia Nims, FNP  Date: 01/17/2022 11:57 AM  Virtual Visit via Video Note   I, Dellia Nims, connected with  Desiree Beasley  ([id number], 48/06/1975) on 01/17/22 at 11:45 AM EST by a video-enabled telemedicine application and verified that I am speaking with the correct person using two identifiers.  Location: Patient: Virtual Visit Location  Patient: Home Provider: Virtual Visit Location Provider: Home Office   I discussed the limitations of evaluation and management by telemedicine and the availability of in person appointments. The patient expressed understanding and agreed to proceed.    History of Present Illness: Desiree Beasley is a 46 y.o. who identifies as a female who was assigned female at birth, and is being seen today for med refills until apptmt with new pcp Dec 4th. She is at urgent care with 2-3 hour wait and they told her to call. She needs refills on BP meds, anxiety meds and meds for vertigo. She denies complaints.  HPI: HPI  Problems: There are no problems to display for this patient.   Allergies:  Allergies  Allergen Reactions   Lisinopril    Sulfa Antibiotics    Medications:  Current Outpatient Medications:    amLODipine (NORVASC) 10 MG tablet, Take 1 tablet (10 mg total) by mouth daily., Disp: 30 tablet, Rfl: 0   chlorthalidone (HYGROTON) 25 MG tablet, Take 1 tablet (25 mg total) by mouth daily., Disp: 30 tablet, Rfl: 0   hydrOXYzine (ATARAX) 10 MG tablet, Take 1 tablet (10 mg total) by mouth 3 (three) times daily as needed., Disp: 60 tablet, Rfl: 0   losartan (COZAAR) 50 MG tablet, Take 1 tablet (50 mg total) by mouth daily., Disp: 30 tablet, Rfl: 0   meclizine (ANTIVERT) 25 MG tablet, Take 1 tablet (25 mg total) by mouth 3 (three) times daily as needed., Disp: 30 tablet, Rfl: 0  NON FORMULARY, , Disp: , Rfl:   Observations/Objective: Patient is well-developed, well-nourished in no acute distress.  Resting comfortably in car parked at urgent care.   Head is normocephalic, atraumatic.  No labored breathing.  Speech is clear and coherent with logical content.  Patient is alert and oriented at baseline.    Assessment and Plan: 1. Hypertension associated with diabetes ([place])  2. Anxiety  3. Dizziness  Refilled meds, follow up with pcp as planned for labs med refills and OV.   Follow Up  Instructions: I discussed the assessment and treatment plan with the patient. The patient was provided an opportunity to ask questions and all were answered. The patient agreed with the plan and demonstrated an understanding of the instructions.  A copy of instructions were sent to the patient via MyChart unless otherwise noted below.     The patient was advised to call back or seek an in-person evaluation if the symptoms worsen or if the condition fails to improve as anticipated.  Time:  I spent 10 minutes with the patient via telehealth technology discussing the above problems/concerns.    Dellia Nims, FNP

## 2022-01-29 ENCOUNTER — Other Ambulatory Visit: Payer: Self-pay | Admitting: Family

## 2022-05-13 ENCOUNTER — Other Ambulatory Visit: Payer: Self-pay | Admitting: Family Medicine

## 2022-05-13 DIAGNOSIS — Z1231 Encounter for screening mammogram for malignant neoplasm of breast: Secondary | ICD-10-CM

## 2023-08-30 ENCOUNTER — Ambulatory Visit
Admission: RE | Admit: 2023-08-30 | Discharge: 2023-08-30 | Disposition: A | Discharge status: Home / Self Care | Payer: Self-pay | Source: Ambulatory Visit | Attending: Emergency Medicine | Admitting: Emergency Medicine

## 2023-08-30 VITALS — BP 158/130 | HR 89 | Temp 98.3°F | Resp 18

## 2023-08-30 DIAGNOSIS — M62838 Other muscle spasm: Secondary | ICD-10-CM

## 2023-08-30 DIAGNOSIS — R110 Nausea: Secondary | ICD-10-CM

## 2023-08-30 DIAGNOSIS — I10 Essential (primary) hypertension: Secondary | ICD-10-CM

## 2023-08-30 DIAGNOSIS — S46911A Strain of unspecified muscle, fascia and tendon at shoulder and upper arm level, right arm, initial encounter: Secondary | ICD-10-CM

## 2023-08-30 MED ORDER — TIZANIDINE HCL 4 MG PO TABS: 4.0000 mg | ORAL_TABLET | Freq: Three times a day (TID) | ORAL | 0 refills | Status: AC | PRN

## 2023-08-30 MED ORDER — IBUPROFEN 600 MG PO TABS: 600.0000 mg | ORAL_TABLET | Freq: Four times a day (QID) | ORAL | 0 refills | Status: AC | PRN

## 2023-08-30 MED ORDER — ONDANSETRON 8 MG PO TBDP: ORAL_TABLET | ORAL | 0 refills | Status: AC

## 2023-08-30 NOTE — ED Triage Notes (Signed)
 Pt presents with right shoulder pain after moving furniture 2 days ago. Pt has taken tylenol for the pain that has not helped with symptoms.   Pt also c/o nausea that started yesterday.

## 2023-08-30 NOTE — ED Provider Notes (Signed)
 HPI  SUBJECTIVE:  Desiree Beasley is a 48 y.o. female who presents with intermittent right shoulder/trapezius pain described as spasm, soreness that lasts several seconds and then resolves starting after she helped a friend move 2 to 3 days ago.  She denies numbness, tingling, weakness in her arm or hand, limitation in motion of the shoulder, direct trauma to the shoulder.  She tried Tylenol without improvement in her symptoms.  Symptoms are worse with movement and use.  She also states that her daughter had a stomach virus last week, and states that she has a upset tummy with very mild nausea for the past 5 days.  No anorexia, vomiting, abdominal pain, diarrhea.  She has a past medical history of hypertension, anxiety and vertigo.  She is a smoker.  No history of chronic kidney disease, MI, CVA, history of right shoulder injury.  She does not measure her blood pressure at home.  States that is always at least systolic 150s when she is at a doctor's office.  States she is compliant with her Norvasc  10 mg chlorthalidone  25 mg losartan  50 mg daily and took it this morning.  There is no record of any other visit in [facility] or Care Everywhere since November 2023.  [facility].  Past Medical History:  Diagnosis Date   Hypertension     History reviewed. No pertinent surgical history.  History reviewed. No pertinent family history.  Social History   Tobacco Use   Smoking status: Some Days    Types: Cigarettes   Smokeless tobacco: Never  Vaping Use   Vaping status: Never Used  Substance Use Topics   Alcohol use: Yes   Drug use: Never    No current facility-administered medications for this encounter.  Current Outpatient Medications:    atorvastatin (LIPITOR) 20 MG tablet, Take 20 mg by mouth daily., Disp: , Rfl:    ibuprofen (ADVIL) 600 MG tablet, Take 1 tablet (600 mg total) by mouth every 6 (six) hours as needed., Disp: 30 tablet, Rfl: 0   NIFEdipine (PROCARDIA XL/NIFEDICAL  XL) 60 MG 24 hr tablet, Take 60 mg by mouth daily., Disp: , Rfl:    ondansetron (ZOFRAN-ODT) 8 MG disintegrating tablet, 1/2- 1 tablet q 8 hr prn nausea, vomiting, Disp: 20 tablet, Rfl: 0   tiZANidine (ZANAFLEX) 4 MG tablet, Take 1 tablet (4 mg total) by mouth every 8 (eight) hours as needed for muscle spasms., Disp: 30 tablet, Rfl: 0   amLODipine  (NORVASC ) 10 MG tablet, Take 1 tablet (10 mg total) by mouth daily., Disp: 30 tablet, Rfl: 0   chlorthalidone  (HYGROTON ) 25 MG tablet, Take 1 tablet (25 mg total) by mouth daily., Disp: 30 tablet, Rfl: 0   losartan  (COZAAR ) 50 MG tablet, Take 1 tablet (50 mg total) by mouth daily., Disp: 30 tablet, Rfl: 0   meclizine  (ANTIVERT ) 25 MG tablet, Take 1 tablet (25 mg total) by mouth 3 (three) times daily as needed., Disp: 30 tablet, Rfl: 0   NON FORMULARY, , Disp: , Rfl:   Allergies  Allergen Reactions   Lisinopril    Sulfa Antibiotics      ROS  As noted in HPI.   Physical Exam  BP (!) 158/130 (BP Location: Right Wrist)   Pulse 89   Temp 98.3 F (36.8 C)   Resp 18   LMP  (LMP Unknown)   SpO2 99%  BP Readings from Last 3 Encounters:  08/30/23 (!) 158/130  12/08/21 (!) 169/109  10/19/21 (!) 149/103  Constitutional: Well developed, well nourished, no acute distress Eyes: PERRL, EOMI, conjunctiva normal bilaterally HENT: Normocephalic, atraumatic,mucus membranes moist Respiratory: Clear to auscultation bilaterally, no rales, no wheezing, no rhonchi Cardiovascular: Normal rate and rhythm, no murmurs, no gallops, no rubs GI: Nondistended skin: No rash, skin intact Musculoskeletal: No edema, no tenderness, no deformities R  shoulder with ROM normal, Drop test normal ,  clavicle NT, A/C joint NT, scapula NT, proximal humerus NT , trapezius  NT, shoulder joint NT, Motor strength normal , Sensation intact LT over deltoid region, distal NVI with hand having intact sensation and strength in the median, radial, and ulnar nerve distribution.    no  pain with internal rotation, no pain with external rotation, negative tenderness in bicipital groove, negative empty can test,   negative liftoff test, RP 2+  Neurologic: Alert & oriented x 3, CN III-XII grossly intact, no motor deficits, sensation grossly intact Psychiatric: Speech and behavior appropriate   ED Course   Medications - No data to display  Orders Placed This Encounter  Procedures   Recheck vitals    Standing Status:   Standing    Number of Occurrences:   1   No results found for this or any previous visit (from the past 24 hours). No results found.  ED Clinical Impression  1. Strain of right shoulder, initial encounter   2. Muscle spasm   3. Elevated blood pressure reading in office with diagnosis of hypertension   4. Nausea      ED Assessment/Plan    # Previous records reviewed.  As noted in HPI.  1.  Shoulder strain.  Patient is currently nontender.  She has full range of motion.  Doubt fracture, dislocation, deferring imaging at this time.  She is describing her pain as spasm like.  Will send home with Zanaflex, Tylenol/ibuprofen together 3 times a day if the pain becomes more consistent.  2.  Elevated blood pressure reading with diagnosis of hypertension.  Repeat blood pressure 158/130. discussed with patient that it was at an alarming level.  She is asymptomatic. States that it is always high when she goes to the doctor.  States that it tends to run systolic 150s when she sees her PCP.  She has taken her blood pressure medications today.  She does not measure her blood pressure at home, so is not sure what it has been recently.  Pt has no evidence of end organ damage. Pt denies any CNS type sx such as HA, visual changes, focal paresis, or new onset seizure activity. Pt denies any CV sx such as CP, dyspnea, palpitations, pedal edema, tearing pain radiating to back or abd. Pt denied any renal sx such as anuria or hematuria. Pt denies illicit drug use, most  notably cocaine, or recent use of OTC medications such as nasal decongestants.  She states that she will go to see her PCP right now [facility] to have her medications adjusted.  Patient will call us  later today with repeat blood pressure measurement at home and to let us  know what medications were changed.  Giving her hypertensive emergency ED return precautions.  3.  Exposure to stomach bug.  Reports intermittent, very mild nausea,  Will send home with Zofran in case nausea gets worse.  Discussed  MDM, treatment plan, and plan for follow-up with patient Discussed sn/sx that should prompt return to the ED. patient agrees with plan.   Meds ordered this encounter  Medications   tiZANidine (  ZANAFLEX) 4 MG tablet    Sig: Take 1 tablet (4 mg total) by mouth every 8 (eight) hours as needed for muscle spasms.    Dispense:  30 tablet    Refill:  0   ibuprofen (ADVIL) 600 MG tablet    Sig: Take 1 tablet (600 mg total) by mouth every 6 (six) hours as needed.    Dispense:  30 tablet    Refill:  0   ondansetron (ZOFRAN-ODT) 8 MG disintegrating tablet    Sig: 1/2- 1 tablet q 8 hr prn nausea, vomiting    Dispense:  20 tablet    Refill:  0      *This clinic note was created using Scientist, clinical (histocompatibility and immunogenetics). Therefore, there may be occasional mistakes despite careful proofreading. ?    Ethlyn Herd, MD 08/30/23 1409

## 2023-08-30 NOTE — Discharge Instructions (Addendum)
 Your blood pressure is alarmingly high at 184/125.  Fortunately, you are not having any symptoms, but this is a stroke/heart attack level.  Go immediately to see your PCP at the [facility].  I believe that you need to have your medications adjusted.  Call here later today with your blood pressure measurement at home and let us  know what medications were changed.  Decrease your salt intake. diet and exercise will lower your blood pressure significantly. It is important to keep your blood pressure under good control, as having a elevated blood pressure for prolonged periods of time significantly increases your risk of stroke, heart attacks, kidney damage, eye damage, and other problems. Get a validated blood pressure cuff that goes on your arm, not your wrist.  Measure your blood pressure once a day, preferably at the same time every day. Keep a log of this and bring it to your next doctor's appointment.  Bring your blood pressure cuff as well.  Return immediately to the ER if you start having chest pain, headache, problems seeing, problems talking, problems walking, if you feel like you're about to pass out, if you do pass out, if you have a seizure, or for any other concerns.  Heat or ice to your shoulder as needed.  Zanaflex if the muscle spasms become problematic.  600 mg of ibuprofen, 1000 mg of Tylenol if the pain becomes consistent.  Zofran for nausea as needed.  Go to www.goodrx.com  or www.costplusdrugs.com to look up your medications. This will give you a list of where you can find your prescriptions at the most affordable prices. Or ask the pharmacist what the cash price is, or if they have any other discount programs available to help make your medication more affordable. This can be less expensive than what you would pay with insurance.
